# Patient Record
Sex: Female | Born: 1980 | Race: Black or African American | Hispanic: No | Marital: Single | Smoking: Current every day smoker
Health system: Southern US, Community
[De-identification: ages and names within clinical notes are randomized; demographics above are authoritative.]

## PROBLEM LIST (undated history)

## (undated) HISTORY — PX: TUBAL LIGATION: SHX77

---

## 2009-05-01 ENCOUNTER — Emergency Department: Payer: Self-pay | Admitting: Emergency Medicine

## 2009-10-12 IMAGING — US US PELV - US TRANSVAGINAL
1 series · 17 of 25 positions shown · non-contrast
Comparison: none

REASON FOR EXAM: right pelvic pain for one week. Patient has BTL
COMMENTS:   LMP: One week ago

PROCEDURE:     US  - US PELVIS EXAM W/TRANSVAGINAL  - May 01, 2009  [DATE]
RESULT:     Comparison: None
INDICATION: Right pelvic pain. LMP 04/23/2009
TECHNIQUE: Multiple transabdominal gray-scale images and endovaginal
gray-scale images  of the pelvis performed.

[Series 1: us pelv - us transvaginal · 17 of 63 slices shown]
[im 1/63]
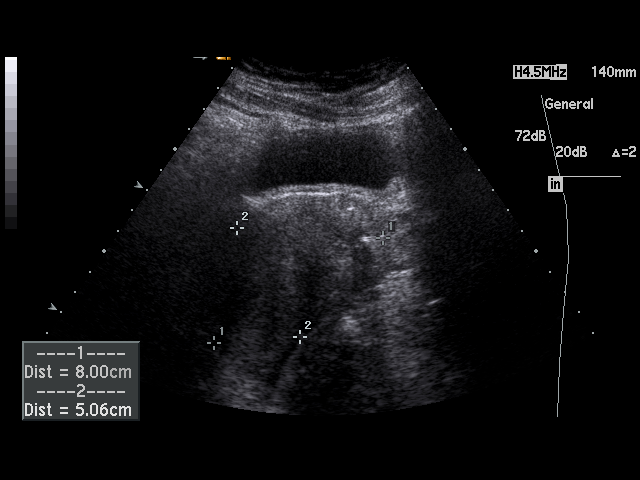
[im 6/63]
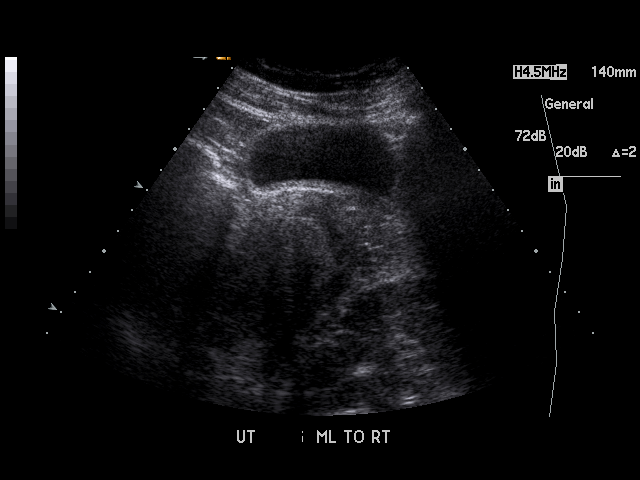
[im 8/63]
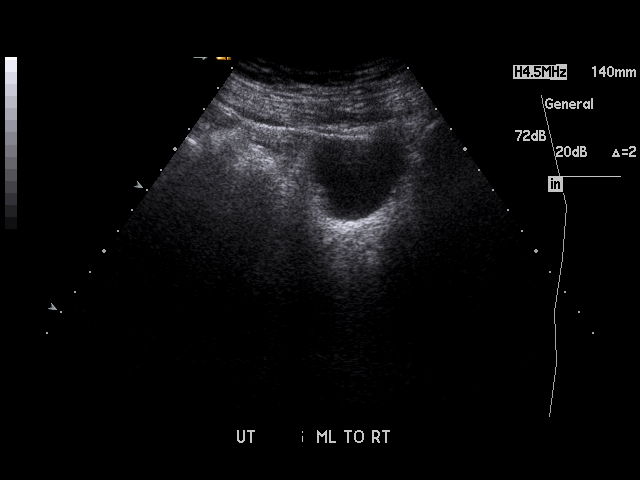
[im 13/63]
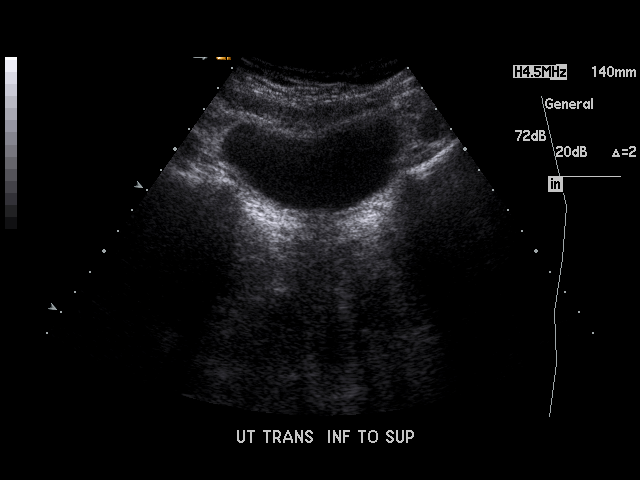
[im 16/63]
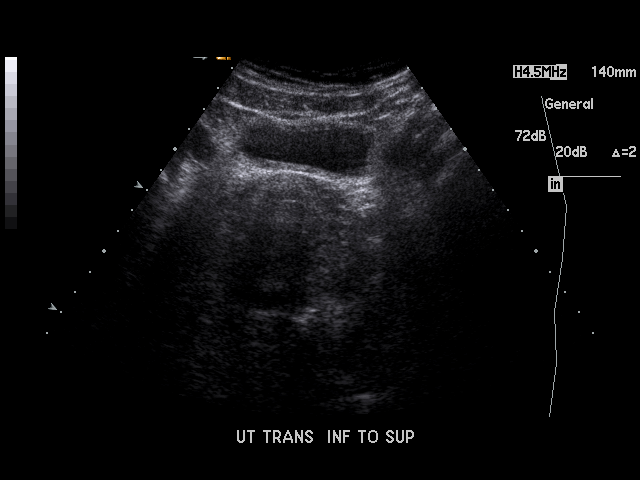
[im 21/63]
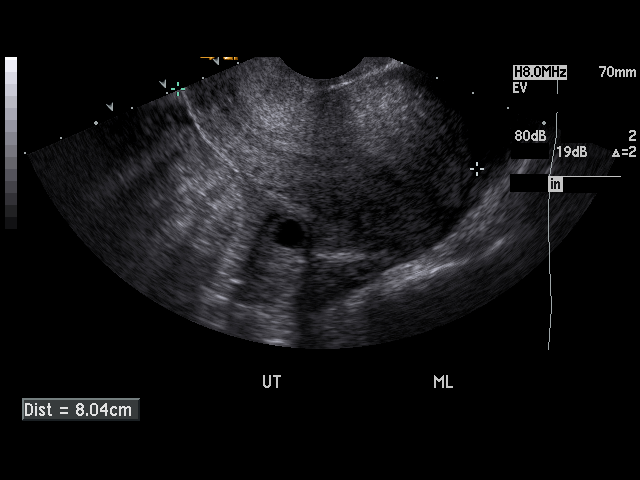
[im 24/63]
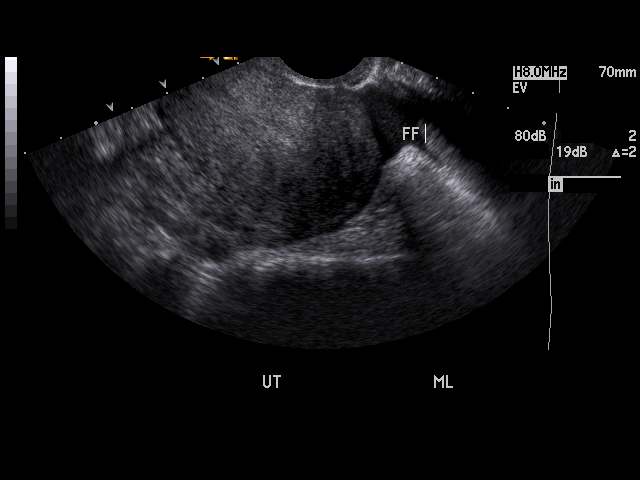
[im 29/63]
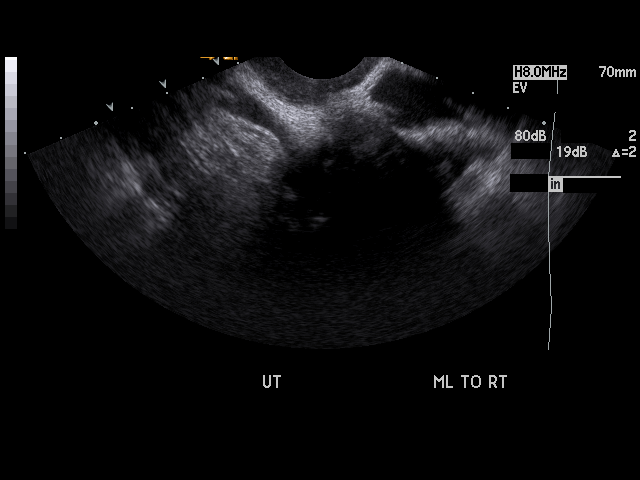
[im 32/63]
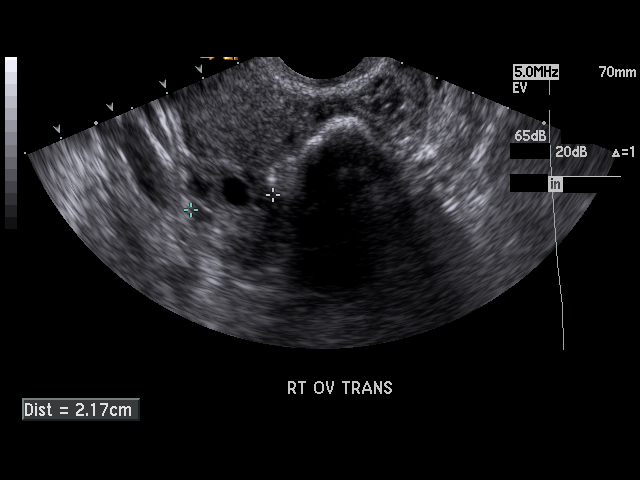
[im 34/63]
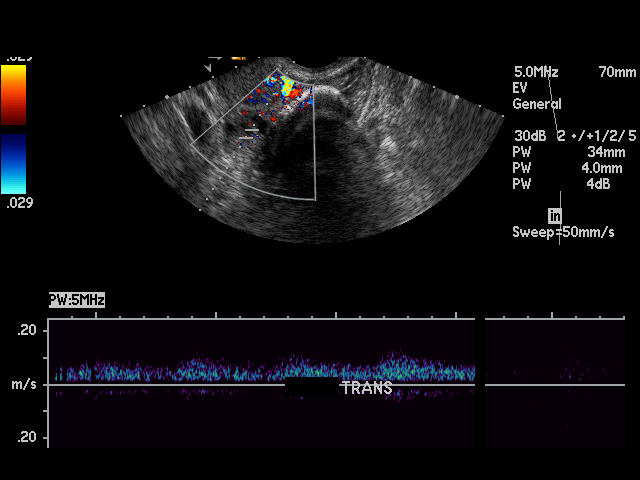
[im 39/63]
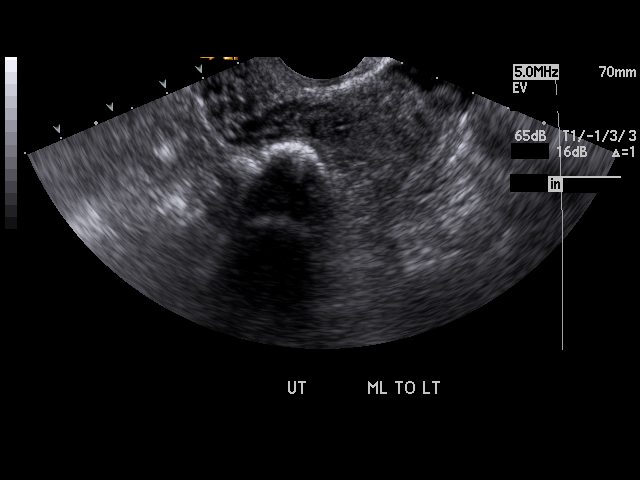
[im 42/63]
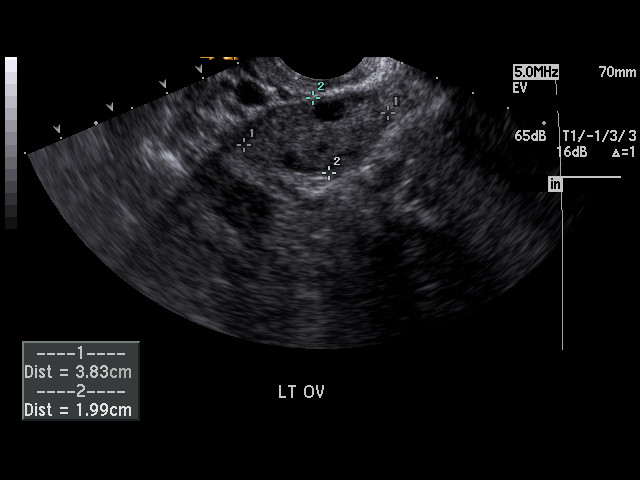
[im 47/63]
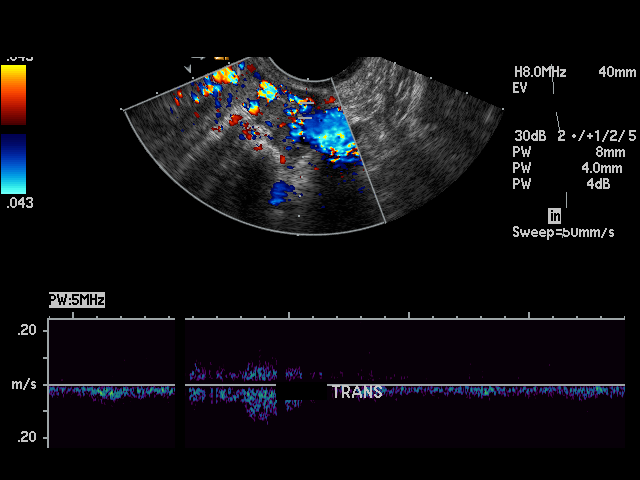
[im 50/63]
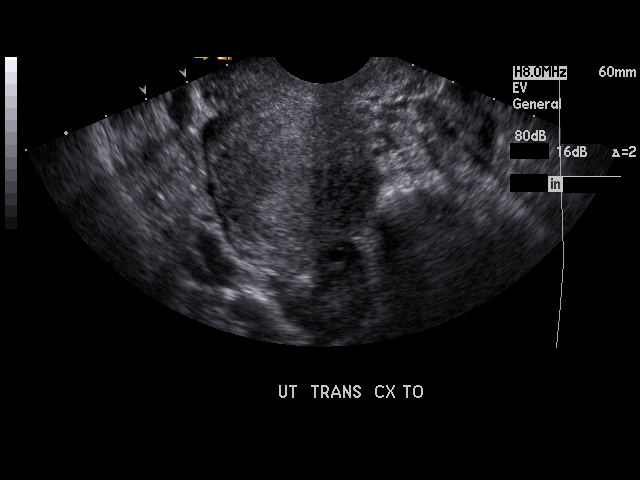
[im 55/63]
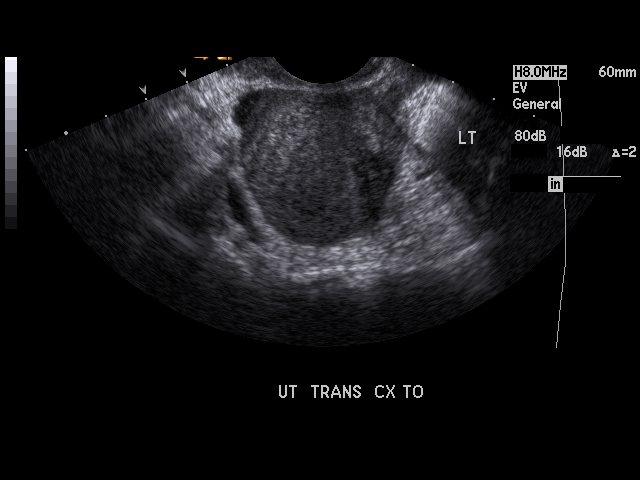
[im 57/63]
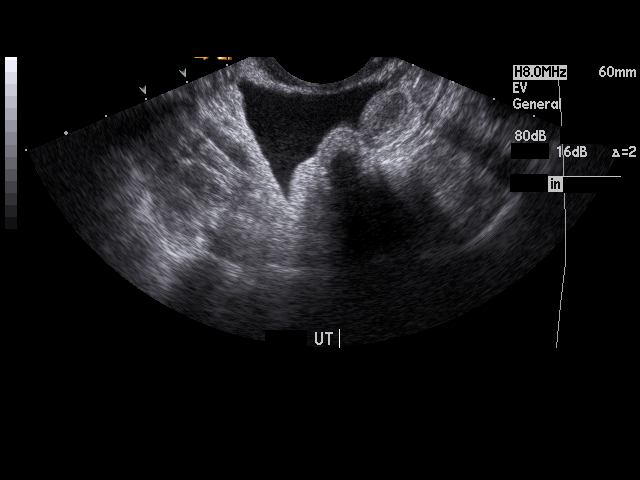
[im 63/63]
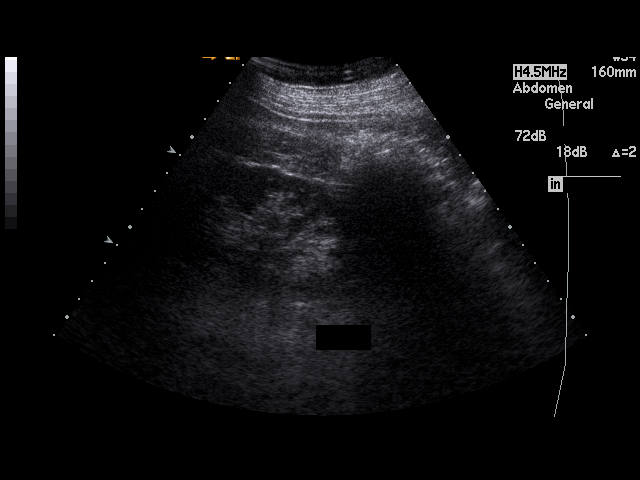

[17 of 25 positions shown; findings below may reference images not displayed]

FINDINGS: The uterus is normal in echotexture measuring 8 x 3.8 x 3.3 cm , with
transabdominal ultrasound. The endometrial stripe is uniform and homogeneous
measuring 6.1 mm.  There are no abnormal solid or cystic myometrial mass
lesions noted.

The right ovary measures 3 x 1.9 x 2.2 cm.  The left ovary measures 3.8 x 2
x 1.6 cm.  There is no adnexal mass. Bilateral arterial Doppler waveforms
are demonstrated. Venous Doppler waveforms are demonstrated on the left
side. Right ovarian venous Doppler could not be demonstrated secondary to
technical limitations. There are no other secondary findings to suggest
ovarian torsion. There is a trace amount of pelvic free fluid likely
physiologic.
IMPRESSION: Unremarkable pelvic ultrasound.

## 2012-12-13 ENCOUNTER — Emergency Department: Payer: Self-pay | Admitting: Emergency Medicine

## 2013-08-31 ENCOUNTER — Emergency Department: Payer: Self-pay | Admitting: Emergency Medicine

## 2013-08-31 LAB — BASIC METABOLIC PANEL
Anion Gap: 5 — ABNORMAL LOW (ref 7–16)
BUN: 20 mg/dL — ABNORMAL HIGH (ref 7–18)
CHLORIDE: 106 mmol/L (ref 98–107)
CREATININE: 0.98 mg/dL (ref 0.60–1.30)
Calcium, Total: 9 mg/dL (ref 8.5–10.1)
Co2: 26 mmol/L (ref 21–32)
EGFR (Non-African Amer.): >60
GLUCOSE: 91 mg/dL (ref 65–99)
OSMOLALITY: 276 (ref 275–301)
POTASSIUM: 3.6 mmol/L (ref 3.5–5.1)
Sodium: 137 mmol/L (ref 136–145)

## 2013-08-31 LAB — CBC
HCT: 39 % (ref 35.0–47.0)
HGB: 13.1 g/dL (ref 12.0–16.0)
MCH: 29.1 pg (ref 26.0–34.0)
MCHC: 33.5 g/dL (ref 32.0–36.0)
MCV: 87 fL (ref 80–100)
PLATELETS: 251 10*3/uL (ref 150–440)
RBC: 4.49 10*6/uL (ref 3.80–5.20)
RDW: 14.6 % — AB (ref 11.5–14.5)
WBC: 7.1 10*3/uL (ref 3.6–11.0)

## 2013-08-31 LAB — TROPONIN I: Troponin-I: <0.02 ng/mL

## 2015-05-08 IMAGING — CR DG CHEST 1V PORT
1 series · 1 of 1 positions shown · non-contrast
Comparison: None.

CLINICAL DATA: Chest pain

EXAM:
PORTABLE CHEST - 1 VIEW

[ap]
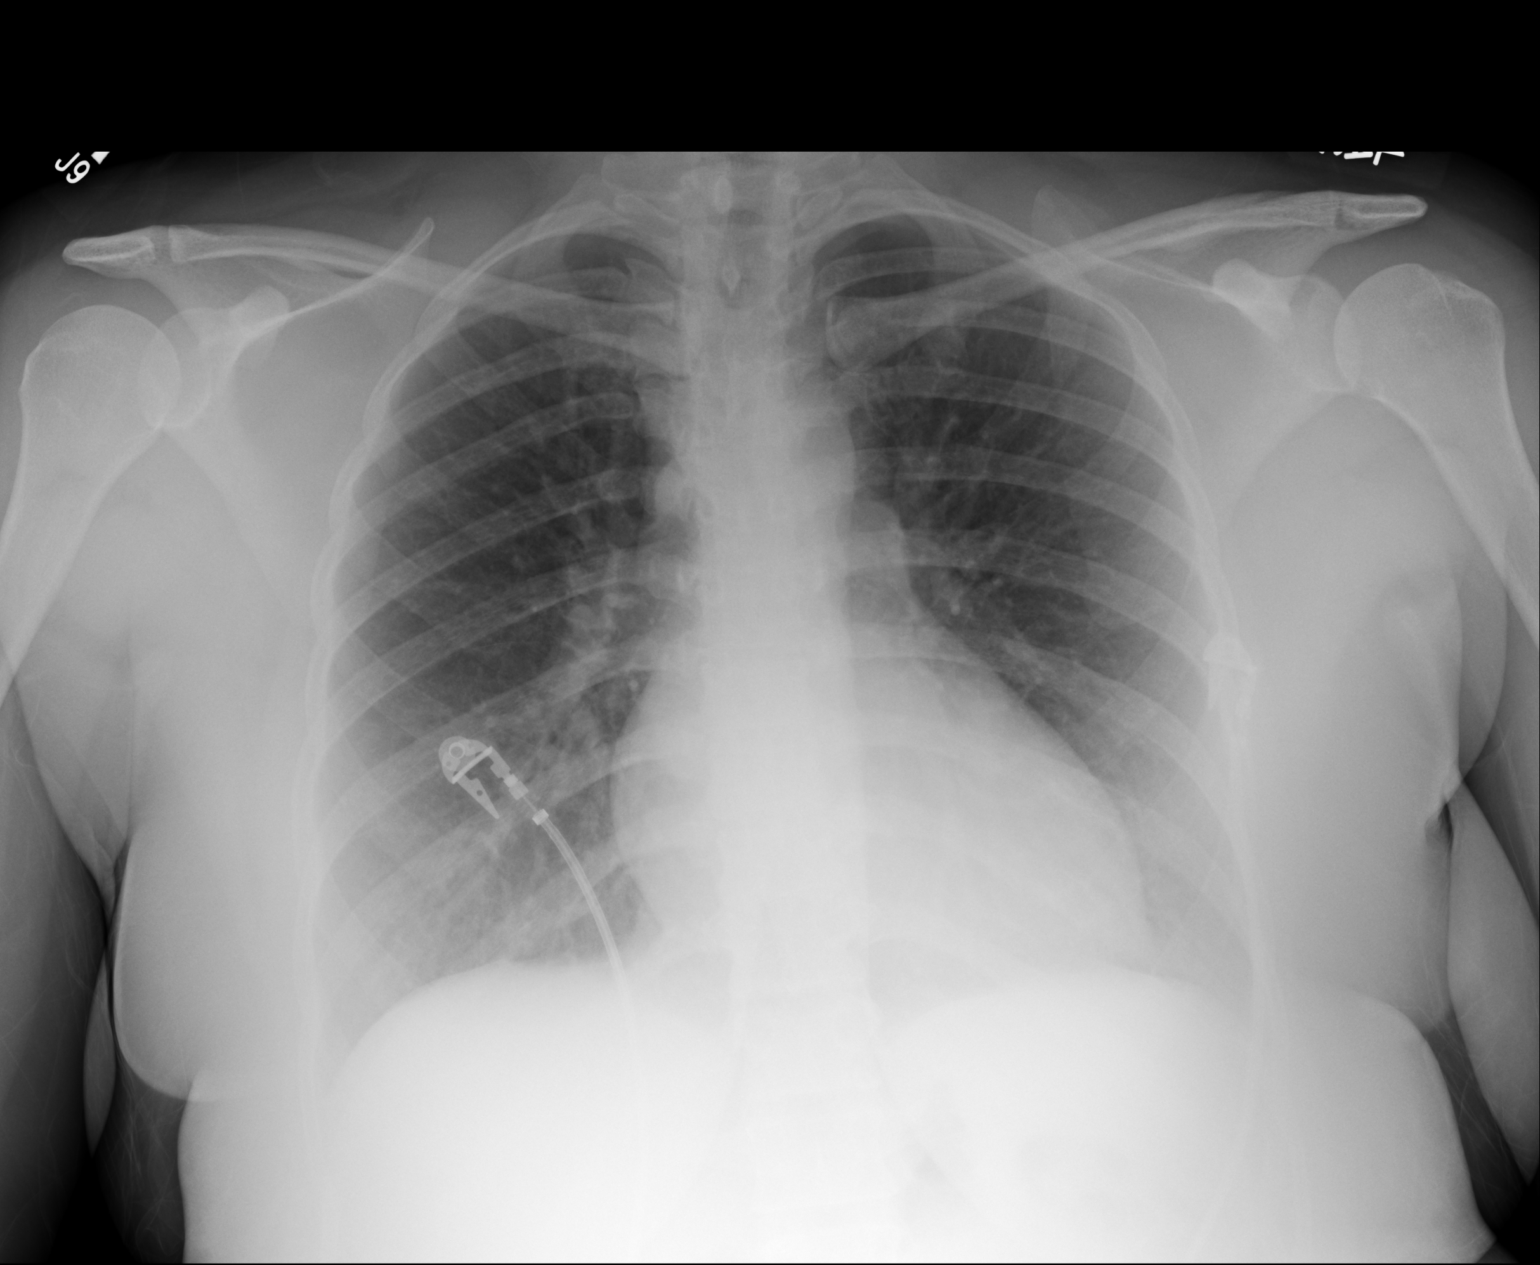

[1 of 1 positions shown; findings below may reference images not displayed]

FINDINGS: The lungs are clear. Heart is upper normal in size with normal
pulmonary vascularity. No adenopathy. No bone lesions. No
pneumothorax.
IMPRESSION: No edema or consolidation.

## 2015-06-03 ENCOUNTER — Emergency Department
Admission: EM | Admit: 2015-06-03 | Discharge: 2015-06-03 | Disposition: A | Discharge status: Home / Self Care | Payer: Medicaid Other | Attending: Emergency Medicine | Admitting: Emergency Medicine

## 2015-06-03 DIAGNOSIS — J039 Acute tonsillitis, unspecified: Secondary | ICD-10-CM | POA: Diagnosis not present

## 2015-06-03 DIAGNOSIS — J069 Acute upper respiratory infection, unspecified: Secondary | ICD-10-CM | POA: Insufficient documentation

## 2015-06-03 DIAGNOSIS — J029 Acute pharyngitis, unspecified: Secondary | ICD-10-CM | POA: Diagnosis present

## 2015-06-03 LAB — POCT RAPID STREP A: Streptococcus, Group A Screen (Direct): NEGATIVE

## 2015-06-03 MED ORDER — LIDOCAINE VISCOUS 2 % MT SOLN
15.0000 mL | Freq: Once | OROMUCOSAL | Status: AC
  Administered 2015-06-03: 15 mL via OROMUCOSAL
  Filled 2015-06-03: qty 15

## 2015-06-03 MED ORDER — IBUPROFEN 800 MG PO TABS: 800.0000 mg | ORAL_TABLET | Freq: Three times a day (TID) | ORAL | Status: DC | PRN

## 2015-06-03 MED ORDER — DIPHENHYDRAMINE HCL 12.5 MG/5ML PO ELIX
ORAL_SOLUTION | ORAL | Status: AC
  Administered 2015-06-03: 12.5 mg via ORAL
  Filled 2015-06-03: qty 10

## 2015-06-03 MED ORDER — PROMETHAZINE-DM 6.25-15 MG/5ML PO SYRP: 5.0000 mL | ORAL_SOLUTION | Freq: Four times a day (QID) | ORAL | Status: DC | PRN

## 2015-06-03 MED ORDER — DIPHENHYDRAMINE HCL 12.5 MG/5ML PO ELIX
12.5000 mg | ORAL_SOLUTION | Freq: Once | ORAL | Status: AC
  Administered 2015-06-03: 12.5 mg via ORAL
  Filled 2015-06-03: qty 5

## 2015-06-03 MED ORDER — AMOXICILLIN 500 MG PO CAPS: 500.0000 mg | ORAL_CAPSULE | Freq: Three times a day (TID) | ORAL | Status: DC

## 2015-06-03 NOTE — ED Notes (Signed)
Pt presents with c/o sore throat, fever to 99.8 onset yesterday. + runny nose, denies cough

## 2015-06-03 NOTE — ED Provider Notes (Signed)
[facility] Emergency Department Provider Note ____________________________________________  Time seen: Approximately 7:15 AM  I have reviewed the triage vital signs and the nursing notes.   HISTORY  Chief Complaint Sore Throat    HPI Elizabeth ResidesKaterina Myers is a 34 y.o. female patient complaining of 2 days of sore throat and nasal congestion and runny nose. Patient also states she has low-grade fever. No palliative measures taken for this complaint. Patient rates the pain discomfort as a 6/10.   History reviewed. No pertinent past medical history.  There are no active problems to display for this patient.   Past Surgical History  Procedure Laterality Date  . Tubal ligation      No current outpatient prescriptions on file.  Allergies Review of patient's allergies indicates no known allergies.  No family history on file.  Social History Social History  Substance Use Topics  . Smoking status: Never Smoker   . Smokeless tobacco: None  . Alcohol Use: Yes     Comment: on occasion    Review of Systems Constitutional: Fever and chills  Eyes: No visual changes. ENT: Sore throat Cardiovascular: Denies chest pain. Respiratory: Denies shortness of breath. Gastrointestinal: No abdominal pain.  No nausea, no vomiting.  No diarrhea.  No constipation. Genitourinary: Negative for dysuria. Musculoskeletal: Negative for back pain. Skin: Negative for rash. Neurological: Negative for headaches, focal weakness or numbness. 10-point ROS otherwise negative.  ____________________________________________   PHYSICAL EXAM:  VITAL SIGNS: ED Triage Vitals  Enc Vitals Group     BP 06/03/15 0655 131/87 mmHg     Pulse Rate 06/03/15 0655 82     Resp 06/03/15 0655 16     Temp 06/03/15 0655 98.2 F (36.8 C)     Temp Source 06/03/15 0655 Oral     SpO2 06/03/15 0655 98 %     Weight 06/03/15 0655 192 lb (87.091 kg)     Height 06/03/15 0655 5\' 3"  (1.6 m)     Head  Cir --      Peak Flow --      Pain Score 06/03/15 0656 6     Pain Loc --      Pain Edu? --      Excl. in GC? --     Constitutional: Alert and oriented. Well appearing and in no acute distress. Eyes: Conjunctivae are normal. PERRL. EOMI. Head: Atraumatic. Nose: No congestion/rhinnorhea. Mouth/Throat: Mucous membranes are moist.  Oropharynx erythematous. Bilateral exudative tonsils Neck: No stridor. No cervical spine tenderness to palpation. Hematological/Lymphatic/Immunilogical: Bilateral cervical lymphadenopathy. Cardiovascular: Normal rate, regular rhythm. Grossly normal heart sounds.  Good peripheral circulation. Respiratory: Normal respiratory effort.  No retractions. Lungs CTAB. Gastrointestinal: Soft and nontender. No distention. No abdominal bruits. No CVA tenderness. Musculoskeletal: No lower extremity tenderness nor edema.  No joint effusions. Neurologic:  Normal speech and language. No gross focal neurologic deficits are appreciated. No gait instability. Skin:  Skin is warm, dry and intact. No rash noted. Psychiatric: Mood and affect are normal. Speech and behavior are normal.  ____________________________________________   LABS (all labs ordered are listed, but only abnormal results are displayed)  Labs Reviewed  CULTURE, GROUP A STREP ([facility] ONLY)  POCT RAPID STREP A   ____________________________________________  EKG   ____________________________________________  RADIOLOGY   ____________________________________________   PROCEDURES  Procedure(s) performed: None  Critical Care performed: No  ____________________________________________   INITIAL IMPRESSION / ASSESSMENT AND PLAN / ED COURSE  Pertinent labs & imaging results that were available during my care of  the patient were reviewed by me and considered in my medical decision making (see chart for details).  Exudative tonsillitis. Upper rest or infection. Patient given a prescription for  amoxicillin, Phenergan DM, and ibuprofen. Patient given discharge instructions for home care. Patient [facility]. ____________________________________________   FINAL CLINICAL IMPRESSION(S) / ED DIAGNOSES  Final diagnoses:  Tonsillitis with exudate  URI (upper respiratory infection)      Joni Reining, PA-C 06/03/15 1478  Sharyn Creamer, MD 06/03/15 [phone]

## 2015-06-03 NOTE — Discharge Instructions (Signed)
Tonsillitis °Tonsillitis is an infection of the throat that causes the tonsils to become red, tender, and swollen. Tonsils are collections of lymphoid tissue at the back of the throat. Each tonsil has crevices (crypts). Tonsils help fight nose and throat infections and keep infection from spreading to other parts of the body for the first 18 months of life.  °CAUSES °Sudden (acute) tonsillitis is usually caused by infection with streptococcal bacteria. Long-lasting (chronic) tonsillitis occurs when the crypts of the tonsils become filled with pieces of food and bacteria, which makes it easy for the tonsils to become repeatedly infected. °SYMPTOMS  °Symptoms of tonsillitis include: °· A sore throat, with possible difficulty swallowing. °· White patches on the tonsils. °· Fever. °· Tiredness. °· New episodes of snoring during sleep, when you did not snore before. °· Small, foul-smelling, yellowish-white pieces of material (tonsilloliths) that you occasionally cough up or spit out. The tonsilloliths can also cause you to have bad breath. °DIAGNOSIS °Tonsillitis can be diagnosed through a physical exam. Diagnosis can be confirmed with the results of lab tests, including a throat culture. °TREATMENT  °The goals of tonsillitis treatment include the reduction of the severity and duration of symptoms and prevention of associated conditions. Symptoms of tonsillitis can be improved with the use of steroids to reduce the swelling. Tonsillitis caused by bacteria can be treated with antibiotic medicines. Usually, treatment with antibiotic medicines is started before the cause of the tonsillitis is known. However, if it is determined that the cause is not bacterial, antibiotic medicines will not treat the tonsillitis. If attacks of tonsillitis are severe and frequent, your health care provider may recommend surgery to remove the tonsils (tonsillectomy). °HOME CARE INSTRUCTIONS  °· Rest as much as possible and get plenty of  sleep. °· Drink plenty of fluids. While the throat is very sore, eat soft foods or liquids, such as sherbet, soups, or instant breakfast drinks. °· Eat frozen ice pops. °· Gargle with a warm or cold liquid to help soothe the throat. Mix 1/4 teaspoon of salt and 1/4 teaspoon of baking soda in 8 oz of water. °SEEK MEDICAL CARE IF:  °· Large, tender lumps develop in your neck. °· A rash develops. °· A green, yellow-brown, or bloody substance is coughed up. °· You are unable to swallow liquids or food for 24 hours. °· You notice that only one of the tonsils is swollen. °SEEK IMMEDIATE MEDICAL CARE IF:  °· You develop any new symptoms such as vomiting, severe headache, stiff neck, chest pain, or trouble breathing or swallowing. °· You have severe throat pain along with drooling or voice changes. °· You have severe pain, unrelieved with recommended medications. °· You are unable to fully open the mouth. °· You develop redness, swelling, or severe pain anywhere in the neck. °· You have a fever. °MAKE SURE YOU:  °· Understand these instructions. °· Will watch your condition. °· Will get help right away if you are not doing well or get worse. °  °This information is not intended to replace advice given to you by your health care provider. Make sure you discuss any questions you have with your health care provider. °  °Document Released: 04/17/2005 Document Revised: 07/29/2014 Document Reviewed: 12/25/2012 °Elsevier Interactive Patient Education ©2016 [employer] ° °Upper Respiratory Infection, Adult °Most upper respiratory infections (URIs) are a viral infection of the air passages leading to the lungs. A URI affects the nose, throat, and upper air passages. The most common type of URI   is nasopharyngitis and is typically referred to as "the common cold." °URIs run their course and usually go away on their own. Most of the time, a URI does not require medical attention, but sometimes a bacterial infection in the upper  airways can follow a viral infection. This is called a secondary infection. Sinus and middle ear infections are common types of secondary upper respiratory infections. °Bacterial pneumonia can also complicate a URI. A URI can worsen asthma and chronic obstructive pulmonary disease (COPD). Sometimes, these complications can require emergency medical care and may be life threatening.  °CAUSES °Almost all URIs are caused by viruses. A virus is a type of germ and can spread from one person to another.  °RISKS FACTORS °You may be at risk for a URI if:  °· You smoke.   °· You have chronic heart or lung disease. °· You have a weakened defense (immune) system.   °· You are very young or very old.   °· You have nasal allergies or asthma. °· You work in crowded or poorly ventilated areas. °· You work in health care facilities or schools. °SIGNS AND SYMPTOMS  °Symptoms typically develop 2-3 days after you come in contact with a cold virus. Most viral URIs last 7-10 days. However, viral URIs from the influenza virus (flu virus) can last 14-18 days and are typically more severe. Symptoms may include:  °· Runny or stuffy (congested) nose.   °· Sneezing.   °· Cough.   °· Sore throat.   °· Headache.   °· Fatigue.   °· Fever.   °· Loss of appetite.   °· Pain in your forehead, behind your eyes, and over your cheekbones (sinus pain). °· Muscle aches.   °DIAGNOSIS  °Your health care provider may diagnose a URI by: °· Physical exam. °· Tests to check that your symptoms are not due to another condition such as: °¨ Strep throat. °¨ Sinusitis. °¨ Pneumonia. °¨ Asthma. °TREATMENT  °A URI goes away on its own with time. It cannot be cured with medicines, but medicines may be prescribed or recommended to relieve symptoms. Medicines may help: °· Reduce your fever. °· Reduce your cough. °· Relieve nasal congestion. °HOME CARE INSTRUCTIONS  °· Take medicines only as directed by your health care provider.   °· Gargle warm saltwater or take cough  drops to comfort your throat as directed by your health care provider. °· Use a warm mist humidifier or inhale steam from a shower to increase air moisture. This may make it easier to breathe. °· Drink enough fluid to keep your urine clear or pale yellow.   °· Eat soups and other clear broths and maintain good nutrition.   °· Rest as needed.   °· Return to work when your temperature has returned to normal or as your health care provider advises. You may need to stay home longer to avoid infecting others. You can also use a face mask and careful hand washing to prevent spread of the virus. °· Increase the usage of your inhaler if you have asthma.   °· Do not use any tobacco products, including cigarettes, chewing tobacco, or electronic cigarettes. If you need help quitting, ask your health care provider. °PREVENTION  °The best way to protect yourself from getting a cold is to practice good hygiene.  °· Avoid oral or hand contact with people with cold symptoms.   °· Wash your hands often if contact occurs.   °There is no clear evidence that vitamin C, vitamin E, echinacea, or exercise reduces the chance of developing a cold. However, it is always recommended   to get plenty of rest, exercise, and practice good nutrition.  °SEEK MEDICAL CARE IF:  °· You are getting worse rather than better.   °· Your symptoms are not controlled by medicine.   °· You have chills. °· You have worsening shortness of breath. °· You have brown or red mucus. °· You have yellow or brown nasal discharge. °· You have pain in your face, especially when you bend forward. °· You have a fever. °· You have swollen neck glands. °· You have pain while swallowing. °· You have white areas in the back of your throat. °SEEK IMMEDIATE MEDICAL CARE IF:  °· You have severe or persistent: °¨ Headache. °¨ Ear pain. °¨ Sinus pain. °¨ Chest pain. °· You have chronic lung disease and any of the following: °¨ Wheezing. °¨ Prolonged cough. °¨ Coughing up blood. °¨ A  change in your usual mucus. °· You have a stiff neck. °· You have changes in your: °¨ Vision. °¨ Hearing. °¨ Thinking. °¨ Mood. °MAKE SURE YOU:  °· Understand these instructions. °· Will watch your condition. °· Will get help right away if you are not doing well or get worse. °  °This information is not intended to replace advice given to you by your health care provider. Make sure you discuss any questions you have with your health care provider. °  °Document Released: 01/01/2001 Document Revised: 11/22/2014 Document Reviewed: 10/13/2013 °Elsevier Interactive Patient Education ©2016 Elsevier Inc. ° °

## 2015-06-06 LAB — CULTURE, GROUP A STREP (THRC)

## 2019-05-17 ENCOUNTER — Ambulatory Visit: Payer: Self-pay | Admitting: Advanced Practice Midwife

## 2019-05-17 ENCOUNTER — Other Ambulatory Visit: Payer: Self-pay

## 2019-05-17 ENCOUNTER — Encounter: Payer: Self-pay | Admitting: Advanced Practice Midwife

## 2019-05-17 DIAGNOSIS — Z9851 Tubal ligation status: Secondary | ICD-10-CM | POA: Insufficient documentation

## 2019-05-17 DIAGNOSIS — Z113 Encounter for screening for infections with a predominantly sexual mode of transmission: Secondary | ICD-10-CM

## 2019-05-17 DIAGNOSIS — B9689 Other specified bacterial agents as the cause of diseases classified elsewhere: Secondary | ICD-10-CM

## 2019-05-17 DIAGNOSIS — F17200 Nicotine dependence, unspecified, uncomplicated: Secondary | ICD-10-CM | POA: Insufficient documentation

## 2019-05-17 DIAGNOSIS — N760 Acute vaginitis: Secondary | ICD-10-CM

## 2019-05-17 LAB — WET PREP FOR TRICH, YEAST, CLUE
Trichomonas Exam: NEGATIVE
Yeast Exam: NEGATIVE

## 2019-05-17 MED ORDER — METRONIDAZOLE 500 MG PO TABS: 500.0000 mg | ORAL_TABLET | Freq: Two times a day (BID) | ORAL | 0 refills | Status: AC

## 2019-05-17 NOTE — Addendum Note (Signed)
Addended by: Hal Morales A on: 05/17/2019 04:33 PM   Modules accepted: Orders

## 2019-05-17 NOTE — Progress Notes (Addendum)
Here today for STD screening. Accepts bloodwork. Hal Morales, RN Wet Prep results reviewed. Patient treated for BV per standing and verbal orders. Hal Morales, RN

## 2019-05-17 NOTE — Progress Notes (Signed)
    STI clinic/screening visit  Subjective:  Elizabeth Myers is a 38 y.o. female being seen today for an STI screening visit. The patient reports they do have symptoms.  Patient has the following medical conditions:   Patient Active Problem List   Diagnosis Date Noted  . Morbid obesity ([place]) 192 lbs 05/17/2019     Chief Complaint  Patient presents with  . SEXUALLY TRANSMITTED DISEASE    HPI  Patient reports yellow d/c x 4 days  See flowsheet for further details and programmatic requirements.    The following portions of the patient's history were reviewed and updated as appropriate: allergies, current medications, past medical history, past social history, past surgical history and problem list.  Objective:  There were no vitals filed for this visit.  Physical Exam Constitutional:      Appearance: Normal appearance. She is obese.  HENT:     Head: Normocephalic and atraumatic.     Mouth/Throat:     Mouth: Mucous membranes are moist.  Eyes:     Conjunctiva/sclera: Conjunctivae normal.  Neck:     Musculoskeletal: Normal range of motion and neck supple.  Pulmonary:     Effort: Pulmonary effort is normal.     Breath sounds: Normal breath sounds.  Abdominal:     Palpations: Abdomen is soft.     Comments: Soft without tenderness, poor tone, increased adipose  Genitourinary:    General: Normal vulva.     Exam position: Lithotomy position.     Pubic Area: No rash or pubic lice.      Labia:        Right: No lesion.        Left: No lesion.      Vagina: Normal.     Cervix: Normal.     Rectum: Normal.  Lymphadenopathy:     Lower Body: No right inguinal adenopathy. No left inguinal adenopathy.  Skin:    General: Skin is warm and dry.  Neurological:     Mental Status: She is alert.       Assessment and Plan:  Elizabeth Myers is a 38 y.o. female presenting to the [facility] Department for STI screening  1. Morbid obesity ([facility]) 192 lbs   2.  Screening examination for venereal disease Treat wet mount per standing orders Immunization nurse consult - WET PREP FOR [place], YEAST, CLUE - Syphilis Serology, [facility] - HIV [place] [facility] - Chlamydia/Gonorrhea [facility]     Return if symptoms worsen or fail to improve.  No future appointments.  Herbie Saxon, CNM

## 2019-09-02 ENCOUNTER — Other Ambulatory Visit: Payer: Self-pay | Admitting: Nurse Practitioner

## 2019-09-02 DIAGNOSIS — Z1231 Encounter for screening mammogram for malignant neoplasm of breast: Secondary | ICD-10-CM

## 2019-12-13 ENCOUNTER — Ambulatory Visit: Payer: Self-pay

## 2019-12-13 ENCOUNTER — Other Ambulatory Visit: Payer: Self-pay | Admitting: Family Medicine

## 2019-12-13 ENCOUNTER — Other Ambulatory Visit: Payer: Self-pay

## 2019-12-13 DIAGNOSIS — M25521 Pain in right elbow: Secondary | ICD-10-CM

## 2020-02-04 ENCOUNTER — Other Ambulatory Visit: Payer: Self-pay

## 2020-02-04 ENCOUNTER — Encounter: Payer: Self-pay | Admitting: Family Medicine

## 2020-02-04 ENCOUNTER — Ambulatory Visit: Payer: Self-pay | Admitting: Family Medicine

## 2020-02-04 DIAGNOSIS — N760 Acute vaginitis: Secondary | ICD-10-CM

## 2020-02-04 DIAGNOSIS — Z113 Encounter for screening for infections with a predominantly sexual mode of transmission: Secondary | ICD-10-CM

## 2020-02-04 DIAGNOSIS — B9689 Other specified bacterial agents as the cause of diseases classified elsewhere: Secondary | ICD-10-CM

## 2020-02-04 LAB — WET PREP FOR TRICH, YEAST, CLUE
Trichomonas Exam: NEGATIVE
Yeast Exam: NEGATIVE

## 2020-02-04 MED ORDER — METRONIDAZOLE 500 MG PO TABS: 500.0000 mg | ORAL_TABLET | Freq: Two times a day (BID) | ORAL | 0 refills | Status: AC

## 2020-02-04 NOTE — Progress Notes (Signed)
[facility] Department STI clinic/screening visit  Subjective:  Elizabeth Myers is a 39 y.o. female being seen today for  Chief Complaint  Patient presents with  . SEXUALLY TRANSMITTED DISEASE    Screening     The patient reports they do have symptoms. Patient reports that they do not desire a pregnancy in the next year. They reported they are not interested in discussing contraception today.   Patient has the following medical conditions:   Patient Active Problem List   Diagnosis Date Noted  . Morbid obesity ([facility]) 192 lbs 05/17/2019  . Smoker 8-13 cpd 05/17/2019  . H/O tubal ligation 2002 05/17/2019    HPI  Pt reports itching, yellow discharge, odor x1 wk. Would like STI screening.  See flowsheet for further details and programmatic requirements.    Patient's last menstrual period was 01/22/2020 (exact date). Last sex: 2 wks ago BCM: none Desires EC? n/a  Last pap per pt/review of record: due in August per pt, goes to PCP Last HIV test per pt/review of record: 04/2019  Last tetanus vaccine: 2016 per pt  No components found for: HCV  The following portions of the patient's history were reviewed and updated as appropriate: allergies, current medications, past medical history, past social history, past surgical history and problem list.  Objective:  There were no vitals filed for this visit.   Physical Exam Vitals and nursing note reviewed.  Constitutional:      Appearance: Normal appearance.  HENT:     Head: Normocephalic and atraumatic.     Mouth/Throat:     Mouth: Mucous membranes are moist.     Pharynx: Oropharynx is clear. No oropharyngeal exudate or posterior oropharyngeal erythema.  Pulmonary:     Effort: Pulmonary effort is normal.  Abdominal:     General: Abdomen is flat.     Palpations: There is no mass.     Tenderness: There is no abdominal tenderness. There is no rebound.  Genitourinary:    General: Normal vulva.     Exam position:  Lithotomy position.     Pubic Area: No rash or pubic lice.      Labia:        Right: No rash or lesion.        Left: No rash or lesion.      Vagina: Vaginal discharge (white, creamy, ph>4.5) present. No erythema, bleeding or lesions.     Cervix: No cervical motion tenderness, discharge, friability, lesion or erythema.     Uterus: Normal.      Adnexa: Right adnexa normal and left adnexa normal.     Rectum: Normal.  Lymphadenopathy:     Head:     Right side of head: No preauricular or posterior auricular adenopathy.     Left side of head: No preauricular or posterior auricular adenopathy.     Cervical: No cervical adenopathy.     Upper Body:     Right upper body: No supraclavicular or axillary adenopathy.     Left upper body: No supraclavicular or axillary adenopathy.     Lower Body: No right inguinal adenopathy. No left inguinal adenopathy.  Skin:    General: Skin is warm and dry.     Findings: No rash.  Neurological:     Mental Status: She is alert and oriented to person, place, and time.      Assessment and Plan:  [name] is a 39 y.o. female presenting to the [facility] Department for STI screening  1. Screening examination for venereal disease -Pt with symptoms. Screenings today as below. Treat wet prep per standing order. -Patient does not meet criteria for HepB, HepC Screening. Declines HIV and syphilis screenings. -Counseled on warning s/sx and when to seek care. Recommended condom use with all sex and discussed importance of condom use for STI prevention. - WET PREP FOR [place]ICH, YEAST, CLUE - Chlamydia/Gonorrhea [place] [facility]  2. BV (bacterial vaginosis) -Wet prep + for BV, RN treated per standing order as below: - metroNIDAZOLE (FLAGYL) 500 MG tablet; Take 1 tablet (500 mg total) by mouth 2 (two) times daily for 7 days.  Dispense: 14 tablet; Refill: 0   Return if symptoms worsen or fail to improve.  No future appointments.  Ann Held,  PA-C

## 2020-02-04 NOTE — Progress Notes (Signed)
[employer] results reviewed by provider Maximiano Coss, PA-C. Per provider orders patient treated for BV. Tawny Hopping, RN

## 2020-02-04 NOTE — Progress Notes (Signed)
Here today for STD screening. Declines bloodwork. Ariyon Mittleman, RN ? ?

## 2020-06-29 ENCOUNTER — Other Ambulatory Visit: Payer: Self-pay

## 2020-06-29 ENCOUNTER — Encounter (HOSPITAL_COMMUNITY): Payer: Self-pay | Admitting: Emergency Medicine

## 2020-06-29 ENCOUNTER — Emergency Department (HOSPITAL_COMMUNITY)
Admission: EM | Admit: 2020-06-29 | Discharge: 2020-06-29 | Disposition: A | Discharge status: Home / Self Care | Payer: 59 | Attending: Emergency Medicine | Admitting: Emergency Medicine

## 2020-06-29 ENCOUNTER — Ambulatory Visit (INDEPENDENT_AMBULATORY_CARE_PROVIDER_SITE_OTHER)
Admission: EM | Admit: 2020-06-29 | Discharge: 2020-06-29 | Disposition: A | Discharge status: Home / Self Care | Payer: 59 | Source: Home / Self Care

## 2020-06-29 DIAGNOSIS — R1032 Left lower quadrant pain: Secondary | ICD-10-CM | POA: Diagnosis not present

## 2020-06-29 DIAGNOSIS — R109 Unspecified abdominal pain: Secondary | ICD-10-CM

## 2020-06-29 DIAGNOSIS — R102 Pelvic and perineal pain: Secondary | ICD-10-CM | POA: Insufficient documentation

## 2020-06-29 DIAGNOSIS — R350 Frequency of micturition: Secondary | ICD-10-CM | POA: Diagnosis not present

## 2020-06-29 DIAGNOSIS — Z5321 Procedure and treatment not carried out due to patient leaving prior to being seen by health care provider: Secondary | ICD-10-CM | POA: Insufficient documentation

## 2020-06-29 DIAGNOSIS — Z3202 Encounter for pregnancy test, result negative: Secondary | ICD-10-CM | POA: Diagnosis not present

## 2020-06-29 DIAGNOSIS — N3001 Acute cystitis with hematuria: Secondary | ICD-10-CM

## 2020-06-29 LAB — POCT URINALYSIS DIPSTICK, ED / UC
Bilirubin Urine: NEGATIVE
Glucose, UA: NEGATIVE mg/dL
Ketones, ur: NEGATIVE mg/dL
Leukocytes,Ua: NEGATIVE
Nitrite: NEGATIVE
Protein, ur: NEGATIVE mg/dL
Specific Gravity, Urine: >=1.03 (ref 1.005–1.030)
Urobilinogen, UA: 0.2 mg/dL (ref 0.0–1.0)
pH: 5 (ref 5.0–8.0)

## 2020-06-29 LAB — COMPREHENSIVE METABOLIC PANEL
ALT: 11 U/L (ref 0–44)
AST: 13 U/L — ABNORMAL LOW (ref 15–41)
Albumin: 4 g/dL (ref 3.5–5.0)
Alkaline Phosphatase: 57 U/L (ref 38–126)
Anion gap: 7 (ref 5–15)
BUN: 19 mg/dL (ref 6–20)
CO2: 24 mmol/L (ref 22–32)
Calcium: 8.8 mg/dL — ABNORMAL LOW (ref 8.9–10.3)
Chloride: 106 mmol/L (ref 98–111)
Creatinine, Ser: 1 mg/dL (ref 0.44–1.00)
GFR, Estimated: >60 mL/min (ref >60)
Glucose, Bld: 139 mg/dL — ABNORMAL HIGH (ref 70–99)
Potassium: 3.7 mmol/L (ref 3.5–5.1)
Sodium: 137 mmol/L (ref 135–145)
Total Bilirubin: 0.3 mg/dL (ref 0.3–1.2)
Total Protein: 7.1 g/dL (ref 6.5–8.1)

## 2020-06-29 LAB — CBC
HCT: 38.7 % (ref 36.0–46.0)
Hemoglobin: 12.6 g/dL (ref 12.0–15.0)
MCH: 28.7 pg (ref 26.0–34.0)
MCHC: 32.6 g/dL (ref 30.0–36.0)
MCV: 88.2 fL (ref 80.0–100.0)
Platelets: 298 10*3/uL (ref 150–400)
RBC: 4.39 MIL/uL (ref 3.87–5.11)
RDW: 14.1 % (ref 11.5–15.5)
WBC: 12 10*3/uL — ABNORMAL HIGH (ref 4.0–10.5)
nRBC: 0 % (ref 0.0–0.2)

## 2020-06-29 LAB — POC URINE PREG, ED: Preg Test, Ur: NEGATIVE

## 2020-06-29 LAB — LIPASE, BLOOD: Lipase: 32 U/L (ref 11–51)

## 2020-06-29 MED ORDER — NITROFURANTOIN MONOHYD MACRO 100 MG PO CAPS: 100.0000 mg | ORAL_CAPSULE | Freq: Two times a day (BID) | ORAL | 0 refills | Status: AC

## 2020-06-29 NOTE — ED Triage Notes (Signed)
Patient is complaining of left lower abdominal pain and pelvic that started yesterday. Patient has no other symptoms.

## 2020-06-29 NOTE — ED Provider Notes (Addendum)
[facility]    CSN: [id number] Arrival date & time: 06/29/20  6834      History   Chief Complaint Chief Complaint  Patient presents with  . Abdominal Pain  . Urinary Frequency    HPI Elizabeth Myers is a 39 y.o. female presenting for lower abd pain and urinary odor x2 days. Endorses 4/10 left sided abd pain radiating to groin. Denies back pain, denies fevers/chills, denies STI sx/risk, denies n/v/d, denies dysuria, denies frequency. She went to [facility] ED last night but left before being seen. States that she has had a UTI in the past with similar symptoms, requiring antibiotic management.   HPI  History reviewed. No pertinent past medical history.  Patient Active Problem List   Diagnosis Date Noted  . Morbid obesity (HCC) 192 lbs 05/17/2019  . Smoker 8-13 cpd 05/17/2019  . H/O tubal ligation 2002 05/17/2019    Past Surgical History:  Procedure Laterality Date  . TUBAL LIGATION      OB History   No obstetric history on file.      Home Medications    Prior to Admission medications   Medication Sig Start Date End Date Taking? Authorizing Provider  nitrofurantoin, macrocrystal-monohydrate, (MACROBID) 100 MG capsule Take 1 capsule (100 mg total) by mouth 2 (two) times daily. 06/29/20   Rhys Martini, PA-C    Family History History reviewed. No pertinent family history.  Social History Social History   Tobacco Use  . Smoking status: Current Every Day Smoker    Packs/day: 0.75    Types: Cigarettes  . Smokeless tobacco: Never Used  Vaping Use  . Vaping Use: Never used  Substance Use Topics  . Alcohol use: Yes    Alcohol/week: 2.0 standard drinks    Types: 2 Standard drinks or equivalent per week    Comment: 3-4x/mo  . Drug use: Never     Allergies   Patient has no known allergies.   Review of Systems Review of Systems  Constitutional: Negative for chills and fever.  Gastrointestinal: Positive for abdominal pain. Negative for diarrhea,  nausea and vomiting.  Genitourinary: Negative for dysuria, flank pain, frequency, genital sores, hematuria, pelvic pain and urgency.  All other systems reviewed and are negative.    Physical Exam Triage Vital Signs ED Triage Vitals  Enc Vitals Group     BP 06/29/20 0838 116/84     Pulse Rate 06/29/20 0838 85     Resp 06/29/20 0838 16     Temp 06/29/20 0838 (!) 97.5 F (36.4 C)     Temp Source 06/29/20 0838 Oral     SpO2 06/29/20 0838 100 %     Weight --      Height --      Head Circumference --      Peak Flow --      Pain Score 06/29/20 0836 5     Pain Loc --      Pain Edu? --      Excl. in GC? --    No data found.  Updated Vital Signs BP 116/84 (BP Location: Right Arm)   Pulse 85   Temp (!) 97.5 F (36.4 C) (Oral)   Resp 16   LMP 06/05/2020   SpO2 100%   Visual Acuity Right Eye Distance:   Left Eye Distance:   Bilateral Distance:    Right Eye Near:   Left Eye Near:    Bilateral Near:     Physical Exam Vitals reviewed.  Constitutional:      Appearance: She is well-developed. She is not ill-appearing.  HENT:     Head: Normocephalic and atraumatic.  Cardiovascular:     Rate and Rhythm: Normal rate and regular rhythm.     Heart sounds: Normal heart sounds.  Pulmonary:     Breath sounds: Normal breath sounds.  Abdominal:     General: Abdomen is flat. Bowel sounds are normal. There is no distension.     Palpations: Abdomen is soft.     Tenderness: There is no abdominal tenderness. There is no right CVA tenderness, left CVA tenderness, guarding or rebound.  Neurological:     Mental Status: She is alert.      UC Treatments / Results  Labs (all labs ordered are listed, but only abnormal results are displayed) Labs Reviewed  POCT URINALYSIS DIPSTICK, ED / UC - Abnormal; Notable for the following components:      Result Value   Hgb urine dipstick TRACE (*)    All other components within normal limits  URINE CULTURE  POC URINE PREG, ED     EKG   Radiology No results found.  Procedures Procedures (including critical care time)  Medications Ordered in UC Medications - No data to display  Initial Impression / Assessment and Plan / UC Course  I have reviewed the triage vital signs and the nursing notes.  Pertinent labs & imaging results that were available during my care of the patient were reviewed by me and considered in my medical decision making (see chart for details).  Clinical Course as of 06/29/20 0931  Thu Jun 29, 2020  4967 POCT Urinalysis, Dipstick [LG]    Clinical Course User Index [LG] Rhys Martini, PA-C   UA today showing trace Hgb, otherwise wnl. Urine pregnancy negative. CBC drawn 12 hours ago showing WBC slightly elevated, otherwise wnl. Given patient history of UTI, discussed option of starting abx today vs awaiting culture results; patient prefers to start abx today. Plan to treat with Macrobid as below. Culture sent. Return precautions- worsening abd pain, new onset of back pain/fevers, worsening of symptoms, etc.   Final Clinical Impressions(s) / UC Diagnoses   Final diagnoses:  Acute cystitis with hematuria     Discharge Instructions     Take the antibiotic as directed. Continue to hydrate well. Seek medical attention if you have worsening abdominal or back pain, worsening urinary symptoms, or if you develop fevers.     ED Prescriptions    Medication Sig Dispense Auth. Provider   nitrofurantoin, macrocrystal-monohydrate, (MACROBID) 100 MG capsule Take 1 capsule (100 mg total) by mouth 2 (two) times daily. 10 capsule Rhys Martini, PA-C     PDMP not reviewed this encounter.   Rhys Martini, PA-C 06/29/20 0931    Rhys Martini, PA-C 06/29/20 [phone]

## 2020-06-29 NOTE — ED Triage Notes (Addendum)
Pt presents with lower abdominal pain and odor to urine xs 2 days. Pt went to [facility] ED last night but was not seen before leaving. Had blood work drawn and resulted.

## 2020-06-29 NOTE — Discharge Instructions (Signed)
Take the antibiotic as directed. Continue to hydrate well. Seek medical attention if you have worsening abdominal or back pain, worsening urinary symptoms, or if you develop fevers.

## 2020-06-30 LAB — URINE CULTURE: Culture: 40000 — AB

## 2020-07-01 ENCOUNTER — Ambulatory Visit (HOSPITAL_COMMUNITY)
Admission: EM | Admit: 2020-07-01 | Discharge: 2020-07-01 | Disposition: A | Discharge status: Home / Self Care | Payer: 59 | Attending: Physician Assistant | Admitting: Physician Assistant

## 2020-07-01 ENCOUNTER — Encounter (HOSPITAL_COMMUNITY): Payer: Self-pay | Admitting: Emergency Medicine

## 2020-07-01 ENCOUNTER — Other Ambulatory Visit: Payer: Self-pay

## 2020-07-01 DIAGNOSIS — R8290 Unspecified abnormal findings in urine: Secondary | ICD-10-CM | POA: Diagnosis not present

## 2020-07-01 DIAGNOSIS — F17210 Nicotine dependence, cigarettes, uncomplicated: Secondary | ICD-10-CM | POA: Insufficient documentation

## 2020-07-01 DIAGNOSIS — Z113 Encounter for screening for infections with a predominantly sexual mode of transmission: Secondary | ICD-10-CM | POA: Diagnosis not present

## 2020-07-01 DIAGNOSIS — R109 Unspecified abdominal pain: Secondary | ICD-10-CM | POA: Insufficient documentation

## 2020-07-01 LAB — POCT URINALYSIS DIPSTICK, ED / UC
Bilirubin Urine: NEGATIVE
Glucose, UA: NEGATIVE mg/dL
Ketones, ur: NEGATIVE mg/dL
Leukocytes,Ua: NEGATIVE
Nitrite: NEGATIVE
Protein, ur: NEGATIVE mg/dL
Specific Gravity, Urine: >=1.03 (ref 1.005–1.030)
Urobilinogen, UA: 0.2 mg/dL (ref 0.0–1.0)
pH: 5.5 (ref 5.0–8.0)

## 2020-07-01 NOTE — ED Provider Notes (Signed)
[facility]    CSN: [id number] Arrival date & time: 07/01/20  1004      History   Chief Complaint Chief Complaint  Patient presents with  . Follow-up    HPI Elizabeth Myers is a 39 y.o. female.   Patient here for follow up on pelvic pressure.  Urine culture positive for lactobacillus.  She was advised to come back for additional testing, so she returnes today.  Denies dysuria, frequency ,urgency, hematuria, vaginal discharge.  LMP now.  No known exposures to STDs.     History reviewed. No pertinent past medical history.  Patient Active Problem List   Diagnosis Date Noted  . Morbid obesity (HCC) 192 lbs 05/17/2019  . Smoker 8-13 cpd 05/17/2019  . H/O tubal ligation 2002 05/17/2019    Past Surgical History:  Procedure Laterality Date  . TUBAL LIGATION      OB History   No obstetric history on file.      Home Medications    Prior to Admission medications   Medication Sig Start Date End Date Taking? Authorizing Provider  nitrofurantoin, macrocrystal-monohydrate, (MACROBID) 100 MG capsule Take 1 capsule (100 mg total) by mouth 2 (two) times daily. 06/29/20   Rhys Martini, PA-C    Family History Family History  Problem Relation Age of Onset  . Diabetes Mother   . Diabetes Father     Social History Social History   Tobacco Use  . Smoking status: Current Every Day Smoker    Packs/day: 0.75    Types: Cigarettes  . Smokeless tobacco: Never Used  Vaping Use  . Vaping Use: Never used  Substance Use Topics  . Alcohol use: Yes    Alcohol/week: 2.0 standard drinks    Types: 2 Standard drinks or equivalent per week    Comment: 3-4x/mo  . Drug use: Never     Allergies   Patient has no known allergies.   Review of Systems Review of Systems  Constitutional: Negative for chills, fatigue and fever.  Gastrointestinal: Negative for abdominal pain, diarrhea, nausea and vomiting.  Genitourinary: Positive for pelvic pain (as discomfort) and  vaginal bleeding (on menstrual cycle). Negative for decreased urine volume, difficulty urinating, dyspareunia, dysuria, flank pain, frequency, hematuria, menstrual problem, urgency, vaginal discharge and vaginal pain.  Musculoskeletal: Negative for arthralgias, back pain and myalgias.  Skin: Negative for color change and rash.  Neurological: Negative for seizures and syncope.  Psychiatric/Behavioral: Negative for sleep disturbance. The patient is not nervous/anxious.   All other systems reviewed and are negative.    Physical Exam Triage Vital Signs ED Triage Vitals  Enc Vitals Group     BP 07/01/20 1019 118/87     Pulse Rate 07/01/20 1019 79     Resp --      Temp 07/01/20 1019 98.9 F (37.2 C)     Temp Source 07/01/20 1019 Oral     SpO2 07/01/20 1019 96 %     Weight --      Height --      Head Circumference --      Peak Flow --      Pain Score 07/01/20 1018 4     Pain Loc --      Pain Edu? --      Excl. in GC? --    No data found.  Updated Vital Signs BP 118/87 (BP Location: Left Arm)   Pulse 79   Temp 98.9 F (37.2 C) (Oral)   LMP 06/29/2020 (Exact Date)  SpO2 96%   Visual Acuity Right Eye Distance:   Left Eye Distance:   Bilateral Distance:    Right Eye Near:   Left Eye Near:    Bilateral Near:     Physical Exam Vitals and nursing note reviewed.  Constitutional:      General: She is not in acute distress.    Appearance: Normal appearance. She is well-developed. She is not ill-appearing.  HENT:     Head: Normocephalic and atraumatic.     Nose: Nose normal.  Eyes:     General: No scleral icterus.    Conjunctiva/sclera: Conjunctivae normal.     Pupils: Pupils are equal, round, and reactive to light.  Cardiovascular:     Rate and Rhythm: Normal rate and regular rhythm.  Pulmonary:     Effort: Pulmonary effort is normal. No respiratory distress.  Abdominal:     General: Bowel sounds are normal.     Palpations: Abdomen is soft.     Tenderness: There is  no abdominal tenderness. There is no right CVA tenderness, left CVA tenderness or guarding.  Genitourinary:    Comments: declined GU exam Musculoskeletal:     Cervical back: Normal range of motion and neck supple. No rigidity.  Skin:    General: Skin is warm and dry.     Capillary Refill: Capillary refill takes less than 2 seconds.  Neurological:     General: No focal deficit present.     Mental Status: She is alert and oriented to person, place, and time.     Motor: No weakness.     Gait: Gait normal.  Psychiatric:        Mood and Affect: Mood normal.        Behavior: Behavior normal.      UC Treatments / Results  Labs (all labs ordered are listed, but only abnormal results are displayed) Labs Reviewed  POCT URINALYSIS DIPSTICK, ED / UC - Abnormal; Notable for the following components:      Result Value   Hgb urine dipstick LARGE (*)    All other components within normal limits  CERVICOVAGINAL ANCILLARY ONLY    EKG   Radiology No results found.  Procedures Procedures (including critical care time)  Medications Ordered in UC Medications - No data to display  Initial Impression / Assessment and Plan / UC Course  I have reviewed the triage vital signs and the nursing notes.  Pertinent labs & imaging results that were available during my care of the patient were reviewed by me and considered in my medical decision making (see chart for details).     Well send out swab for GCT and BV/yeast.  UA Dip positive for blood, however, she started her cycle.  Negative for leuks and nitrates. No sx of UTI. If test is positive we will send antibiotics to pharmacy. If test is negative, follow up with PCP/OB. Final Clinical Impressions(s) / UC Diagnoses   Final diagnoses:  Abdominal pressure  Abnormal urine odor     Discharge Instructions     We will contact you with lab results in 2 - 5 days, the results will also be available on mychart.      ED Prescriptions     None     PDMP not reviewed this encounter.   Evern Core, PA-C 07/01/20 1107

## 2020-07-01 NOTE — ED Triage Notes (Signed)
Pt states that she is here for a follow from lab results. Pt states that she is still having pelvic discomfort.

## 2020-07-01 NOTE — Discharge Instructions (Signed)
We will contact you with lab results in 2 - 5 days, the results will also be available on mychart.

## 2020-07-03 LAB — CERVICOVAGINAL ANCILLARY ONLY
Bacterial Vaginitis (gardnerella): POSITIVE — AB
Candida Glabrata: NEGATIVE
Candida Vaginitis: NEGATIVE
Chlamydia: NEGATIVE
Comment: NEGATIVE
Comment: NEGATIVE
Comment: NEGATIVE
Comment: NEGATIVE
Comment: NEGATIVE
Comment: NORMAL
Neisseria Gonorrhea: NEGATIVE
Trichomonas: NEGATIVE

## 2020-07-04 ENCOUNTER — Telehealth (HOSPITAL_COMMUNITY): Payer: Self-pay | Admitting: Emergency Medicine

## 2020-07-04 MED ORDER — METRONIDAZOLE 500 MG PO TABS
500.0000 mg | ORAL_TABLET | Freq: Two times a day (BID) | ORAL | 0 refills | Status: DC
Start: 2020-07-04 — End: 2020-07-04

## 2020-07-04 MED ORDER — METRONIDAZOLE 500 MG PO TABS: 500.0000 mg | ORAL_TABLET | Freq: Two times a day (BID) | ORAL | 0 refills | Status: AC

## 2020-07-04 NOTE — Telephone Encounter (Signed)
Resent to pharmacy of request

## 2022-08-27 ENCOUNTER — Encounter: Payer: Self-pay | Admitting: Emergency Medicine

## 2022-08-27 ENCOUNTER — Emergency Department: Payer: 59

## 2022-08-27 ENCOUNTER — Emergency Department
Admission: EM | Admit: 2022-08-27 | Discharge: 2022-08-27 | Disposition: A | Discharge status: Home / Self Care | Payer: 59 | Attending: Emergency Medicine | Admitting: Emergency Medicine

## 2022-08-27 ENCOUNTER — Other Ambulatory Visit: Payer: Self-pay

## 2022-08-27 DIAGNOSIS — M545 Low back pain, unspecified: Secondary | ICD-10-CM

## 2022-08-27 LAB — POC URINE PREG, ED: Preg Test, Ur: NEGATIVE

## 2022-08-27 MED ORDER — MELOXICAM 15 MG PO TABS: 15.0000 mg | ORAL_TABLET | Freq: Every day | ORAL | 2 refills | Status: AC

## 2022-08-27 MED ORDER — CYCLOBENZAPRINE HCL 10 MG PO TABS: 10.0000 mg | ORAL_TABLET | Freq: Three times a day (TID) | ORAL | 0 refills | Status: AC | PRN

## 2022-08-27 MED ORDER — KETOROLAC TROMETHAMINE 30 MG/ML IJ SOLN
30.0000 mg | Freq: Once | INTRAMUSCULAR | Status: AC
  Administered 2022-08-27: 30 mg via INTRAMUSCULAR
  Filled 2022-08-27: qty 1

## 2022-08-27 MED ORDER — LIDOCAINE 5 % EX PTCH
1.0000 | MEDICATED_PATCH | CUTANEOUS | Status: DC
  Administered 2022-08-27: 1 via TRANSDERMAL
  Filled 2022-08-27: qty 1

## 2022-08-27 MED ORDER — CYCLOBENZAPRINE HCL 10 MG PO TABS
10.0000 mg | ORAL_TABLET | Freq: Once | ORAL | Status: AC
  Administered 2022-08-27: 10 mg via ORAL
  Filled 2022-08-27: qty 1

## 2022-08-27 NOTE — ED Triage Notes (Signed)
Pt here with lower back pain since yesterday morning. Pt denies injury, states she woke up with pain.

## 2022-08-27 NOTE — ED Provider Notes (Signed)
[facility] Provider Note    Event Date/Time   First MD Initiated Contact with Patient 08/27/22 1019     (approximate)   History   Back Pain   HPI  Elizabeth Myers is a 42 y.o. female with no significant past medical history presents emergency department with low back pain.  States started yesterday.  No known injury.  States she woke up with bad pain in the lower back that goes towards the hip but not down her legs.  Took over-the-counter medication without any relief.  Denies abdominal pain, urinary frequency      Physical Exam   Triage Vital Signs: ED Triage Vitals [08/27/22 0940]  Enc Vitals Group     BP (!) 131/92     Pulse Rate 88     Resp 18     Temp 98 F (36.7 C)     Temp Source Oral     SpO2 100 %     Weight 190 lb 0.6 oz (86.2 kg)     Height 5\' 3"  (1.6 m)     Head Circumference      Peak Flow      Pain Score 9     Pain Loc      Pain Edu?      Excl. in [place]?     Most recent vital signs: Vitals:   08/27/22 0940  BP: (!) 131/92  Pulse: 88  Resp: 18  Temp: 98 F (36.7 C)  SpO2: 100%     General: Awake, no distress.   CV:  Good peripheral perfusion. regular rate and  rhythm Resp:  Normal effort.  Abd:  No distention.   Other:  Patient moves slowly due to discomfort, lumbar spine is tender along L4-L5, 5 or 5 strength in lower extremities, neurovascular is intact   ED Results / Procedures / Treatments   Labs (all labs ordered are listed, but only abnormal results are displayed) Labs Reviewed  POC URINE PREG, ED     EKG     RADIOLOGY X-ray of the lumbar spine    PROCEDURES:   Procedures   MEDICATIONS ORDERED IN ED: Medications  lidocaine (LIDODERM) 5 % 1 patch (1 patch Transdermal Patch Applied 08/27/22 1147)  ketorolac (TORADOL) 30 MG/ML injection 30 mg (30 mg Intramuscular Given 08/27/22 1048)  cyclobenzaprine (FLEXERIL) tablet 10 mg (10 mg Oral Given 08/27/22 1047)     IMPRESSION / MDM /  [place] / ED COURSE  I reviewed the triage vital signs and the nursing notes.                              Differential diagnosis includes, but is not limited to, muscle strain, herniated disc, fracture  Patient's presentation is most consistent with acute complicated illness / injury requiring diagnostic workup.   X-ray of the lumbar spine, Toradol 30 mg IM, Flexeril 10 mg p.o. ordered after x-ray will reevaluate most likely give her a Lidoderm patch   X-ray lumbar spine independently reviewed interpreted by me as being negative for any acute abnormality  Patient did have relief with the medication.  We also applied a Lidoderm patch.  She was given a prescription for meloxicam and Flexeril.  She is to follow-up with her regular doctor if not improving in 3 to 4 days.  Where she can follow-up with orthopedics.  Gentle stretches were described.  She is in agreement treatment  plan.  Discharged stable condition.   FINAL CLINICAL IMPRESSION(S) / ED DIAGNOSES   Final diagnoses:  Acute midline low back pain without sciatica     Rx / DC Orders   ED Discharge Orders          Ordered    meloxicam (MOBIC) 15 MG tablet  Daily        08/27/22 1150    cyclobenzaprine (FLEXERIL) 10 MG tablet  3 times daily PRN        08/27/22 1150             Note:  This document was prepared using Dragon voice recognition software and may include unintentional dictation errors.    Versie Starks, PA-C 08/27/22 1151    Blake Divine, MD 08/27/22 [phone]

## 2022-08-27 NOTE — Discharge Instructions (Signed)
Follow-up with your regular doctor as needed.  Follow-up orthopedics if not improving 1 week.  Try gentle stretches for your mid lower back.  Apply ice to the lower back.  Take medications as prescribed.

## 2022-09-01 ENCOUNTER — Other Ambulatory Visit: Payer: Self-pay

## 2022-09-01 ENCOUNTER — Emergency Department: Payer: 59

## 2022-09-01 ENCOUNTER — Emergency Department
Admission: EM | Admit: 2022-09-01 | Discharge: 2022-09-01 | Disposition: A | Discharge status: Home / Self Care | Payer: 59 | Attending: Emergency Medicine | Admitting: Emergency Medicine

## 2022-09-01 DIAGNOSIS — R300 Dysuria: Secondary | ICD-10-CM

## 2022-09-01 DIAGNOSIS — S39012A Strain of muscle, fascia and tendon of lower back, initial encounter: Secondary | ICD-10-CM

## 2022-09-01 DIAGNOSIS — X58XXXA Exposure to other specified factors, initial encounter: Secondary | ICD-10-CM | POA: Diagnosis not present

## 2022-09-01 DIAGNOSIS — M545 Low back pain, unspecified: Secondary | ICD-10-CM

## 2022-09-01 DIAGNOSIS — S3992XA Unspecified injury of lower back, initial encounter: Secondary | ICD-10-CM | POA: Diagnosis present

## 2022-09-01 LAB — URINALYSIS, ROUTINE W REFLEX MICROSCOPIC
Bilirubin Urine: NEGATIVE
Glucose, UA: NEGATIVE mg/dL
Ketones, ur: NEGATIVE mg/dL
Leukocytes,Ua: NEGATIVE
Nitrite: NEGATIVE
Protein, ur: NEGATIVE mg/dL
Specific Gravity, Urine: 1.017 (ref 1.005–1.030)
pH: 6 (ref 5.0–8.0)

## 2022-09-01 MED ORDER — PREDNISONE 10 MG PO TABS: 10.0000 mg | ORAL_TABLET | Freq: Every day | ORAL | 0 refills | Status: DC

## 2022-09-01 MED ORDER — PREDNISONE 10 MG PO TABS: 10.0000 mg | ORAL_TABLET | Freq: Every day | ORAL | 0 refills | Status: AC

## 2022-09-01 NOTE — ED Triage Notes (Signed)
Pt presents to ED with lower back pain, pt states recently seen for this, pt states possible urinary s/s. NAD noted. Pt denies fevers or chills.

## 2022-09-01 NOTE — ED Provider Notes (Signed)
[facility] Provider Note   CSN: [id number] Arrival date & time: 09/01/22  [id number]     History  Chief Complaint  Patient presents with   Back Pain    Elizabeth Myers is a 42 y.o. female.  With no past medical history presents to the emergency department for evaluation of bilateral lower back pain with dysuria.  She describes a little burning with urination this been present for the last day or 2.  She has had 6 days of lower back pain.  Was seen 5 days ago in the ER, had negative urine and normal x-rays of the lumbar spine.  She denies any history of kidney stones.  She has noticed little bit of blood in urine.  She denies any fevers, abdominal pain, nausea vomiting or diarrhea.  No numbness tingling or radicular symptoms.  No loss of bowel or bladder symptoms.  No vaginal discharge.  History of chronic hematuria.  HPI     Home Medications Prior to Admission medications   Medication Sig Start Date End Date Taking? Authorizing Provider  predniSONE (DELTASONE) 10 MG tablet Take 1 tablet (10 mg total) by mouth daily. 6,5,4,3,2,1 six day taper 09/01/22  Yes Duanne Guess, PA-C  cyclobenzaprine (FLEXERIL) 10 MG tablet Take 1 tablet (10 mg total) by mouth 3 (three) times daily as needed. 08/27/22   Fisher, Linden Dolin, PA-C  meloxicam (MOBIC) 15 MG tablet Take 1 tablet (15 mg total) by mouth daily. 08/27/22 08/27/23  Versie Starks, PA-C  metroNIDAZOLE (FLAGYL) 500 MG tablet Take 1 tablet (500 mg total) by mouth 2 (two) times daily. 07/04/20   Lamptey, Myrene Galas, MD  nitrofurantoin, macrocrystal-monohydrate, (MACROBID) 100 MG capsule Take 1 capsule (100 mg total) by mouth 2 (two) times daily. 06/29/20   Hazel Sams, PA-C      Allergies    Patient has no known allergies.    Review of Systems   Review of Systems  Physical Exam Updated Vital Signs BP (!) 135/100 (BP Location: Left Arm)   Pulse 83   Temp 98.4 F (36.9 C) (Oral)   Resp 16    LMP 08/25/2022 (Exact Date)   SpO2 98%  Physical Exam Constitutional:      Appearance: She is well-developed.  HENT:     Head: Normocephalic and atraumatic.  Eyes:     Conjunctiva/sclera: Conjunctivae normal.  Cardiovascular:     Rate and Rhythm: Normal rate.  Pulmonary:     Effort: Pulmonary effort is normal. No respiratory distress.  Abdominal:     General: There is no distension.     Tenderness: There is no abdominal tenderness. There is no right CVA tenderness, left CVA tenderness or guarding.  Musculoskeletal:        General: Normal range of motion.     Cervical back: Normal range of motion.     Comments: Mild soft tissue tenderness along the lower lumbar spine at the lumbosacral junction with no midline spinous process tenderness.  Both hips move well with internal ex rotation with no discomfort.  Number muscle spasms noted throughout the lower back.  No CVA tenderness bilaterally.  Neurovascular intact in bilateral lower extremities  Skin:    General: Skin is warm.     Findings: No rash.  Neurological:     General: No focal deficit present.     Mental Status: She is alert and oriented to person, place, and time.  Psychiatric:  Mood and Affect: Mood normal.        Behavior: Behavior normal.        Thought Content: Thought content normal.     ED Results / Procedures / Treatments   Labs (all labs ordered are listed, but only abnormal results are displayed) Labs Reviewed  URINALYSIS, ROUTINE W REFLEX MICROSCOPIC - Abnormal; Notable for the following components:      Result Value   Color, Urine YELLOW (*)    APPearance HAZY (*)    Hgb urine dipstick MODERATE (*)    Bacteria, UA RARE (*)    All other components within normal limits  URINE CULTURE    EKG None  Radiology CT Renal Stone Study  Result Date: 09/01/2022 CLINICAL DATA:  Flank and low back pain.  Microscopic hematuria. EXAM: CT ABDOMEN AND PELVIS WITHOUT CONTRAST TECHNIQUE: Multidetector CT  imaging of the abdomen and pelvis was performed following the standard protocol without IV contrast. RADIATION DOSE REDUCTION: This exam was performed according to the departmental dose-optimization program which includes automated exposure control, adjustment of the mA and/or kV according to patient size and/or use of iterative reconstruction technique. COMPARISON:  None Available. FINDINGS: Lower chest: No acute findings. Hepatobiliary: No mass visualized on this unenhanced exam. Tiny calcified gallstone noted, however there is no evidence of cholecystitis or biliary ductal dilatation. Pancreas: No mass or inflammatory process visualized on this unenhanced exam. Spleen:  Within normal limits in size. Adrenals/Urinary tract: No evidence of urolithiasis or hydronephrosis. Unremarkable unopacified urinary bladder. Stomach/Bowel: No evidence of obstruction, inflammatory process, or abnormal fluid collections. Normal appendix visualized. Vascular/Lymphatic: No pathologically enlarged lymph nodes identified. No evidence of abdominal aortic aneurysm. Reproductive: Uterus is mildly enlarged and rounded configuration, likely due to fibroids. Adnexal regions are unremarkable. Other:  None. Musculoskeletal:  No suspicious bone lesions identified. IMPRESSION: No evidence of urolithiasis, hydronephrosis, or other acute findings. Cholelithiasis. No radiographic evidence of cholecystitis. Mildly enlarged uterus, likely due to fibroids. Pelvic ultrasound could be performed for further evaluation if clinically warranted. Electronically Signed   By: Marlaine Hind M.D.   On: 09/01/2022 09:43    Procedures Procedures    Medications Ordered in ED Medications - No data to display  ED Course/ Medical Decision Making/ A&P                             Medical Decision Making Amount and/or Complexity of Data Reviewed Labs: ordered. Radiology: ordered.  42 year old female with slight dysuria with back pain.  She was treated  with meloxicam and Flexeril recently with some improvement of the lower back pain but was concerned about having dysuria today.  Patient's urine analysis today showed no signs of infection.  She was noted to have hematuria.  His CT was obtained and reviewed by me today of the abdomen pelvis looking for kidney stone and there was no signs of kidney stones today.  Remainder of the CT appeared well.  Previous urinalysis is reviewed showing chronic hematuria.  Recommend follow-up with urology.  She is given prednisone to take for lower back pain which does have some muscular components that she is tender along the left and right lower lumbar spine.  She denies any radicular symptoms.  No neurological deficits.  Vital signs are stable.  She understands signs and symptoms return to the ER for. Final Clinical Impression(s) / ED Diagnoses Final diagnoses:  Acute bilateral low back pain without sciatica  Strain of  lumbar region, initial encounter  Dysuria    Rx / DC Orders ED Discharge Orders          Ordered    predniSONE (DELTASONE) 10 MG tablet  Daily        09/01/22 1007              Renata Caprice 09/01/22 1011    Carrie Mew, MD 09/02/22 1842

## 2022-09-01 NOTE — Discharge Instructions (Signed)
Please continue with muscle relaxer.  Take disown taper as prescribed.  Please follow-up with urology to discuss chronic hematuria.  Return to the ER for any worsening symptoms or any urgent changes in your health

## 2022-09-02 LAB — URINE CULTURE: Special Requests: NORMAL

## 2022-11-08 ENCOUNTER — Ambulatory Visit: Payer: 59 | Admitting: Nurse Practitioner

## 2022-11-13 ENCOUNTER — Encounter: Payer: 59 | Admitting: Nurse Practitioner

## 2023-03-14 ENCOUNTER — Ambulatory Visit: Payer: Self-pay | Admitting: Family Medicine

## 2023-03-14 DIAGNOSIS — B3731 Acute candidiasis of vulva and vagina: Secondary | ICD-10-CM

## 2023-03-14 DIAGNOSIS — Z113 Encounter for screening for infections with a predominantly sexual mode of transmission: Secondary | ICD-10-CM

## 2023-03-14 LAB — WET PREP FOR TRICH, YEAST, CLUE: Trichomonas Exam: NEGATIVE

## 2023-03-14 LAB — HM HIV SCREENING LAB: HM HIV Screening: NEGATIVE

## 2023-03-14 MED ORDER — CLOTRIMAZOLE-BETAMETHASONE 1-0.05 % EX CREA
1.0000 | TOPICAL_CREAM | Freq: Every day | CUTANEOUS | Status: AC
Start: 2023-03-14 — End: ?

## 2023-03-14 NOTE — Progress Notes (Signed)
[facility] Department  STI clinic/screening visit [address] [place] [address] [phone]  Subjective:  Elizabeth Myers is a 42 y.o. female being seen today for an STI screening visit. The patient reports they do have symptoms.  Patient reports that they do not desire a pregnancy in the next year.   They reported they are not interested in discussing contraception today.    Patient's last menstrual period was 02/15/2023.  Patient has the following medical conditions:   Patient Active Problem List   Diagnosis Date Noted   Morbid obesity (HCC) 192 lbs 05/17/2019   Smoker 8-13 cpd 05/17/2019   H/O tubal ligation 2002 05/17/2019    Chief Complaint  Patient presents with   SEXUALLY TRANSMITTED DISEASE    Screening. "Light burning sensation and white vaginal discharge x 2 weeks"    HPI  Patient reports to clinic with a 2 week hx of vaginal discharge and burning. States she has had 1 partner.   Does the patient using douching products? No  Last HIV test per patient/review of record was No results found for: "HMHIVSCREEN" No results found for: "HIV" Patient reports last pap was No results found for: "DIAGPAP" No results found for: "SPECADGYN"  Screening for MPX risk: Does the patient have an unexplained rash? No Is the patient MSM? No Does the patient endorse multiple sex partners or anonymous sex partners? No Did the patient have close or sexual contact with a person diagnosed with MPX? No Has the patient traveled outside the [place] where MPX is endemic? No Is there a high clinical suspicion for MPX-- evidenced by one of the following No  -Unlikely to be chickenpox  -Lymphadenopathy  -Rash that present in same phase of evolution on any given body part See flowsheet for further details and programmatic requirements.   Immunization history:   There is no immunization history on file for this patient.   The following portions of the  patient's history were reviewed and updated as appropriate: allergies, current medications, past medical history, past social history, past surgical history and problem list.  Objective:  There were no vitals filed for this visit.  Physical Exam Vitals and nursing note reviewed.  Constitutional:      Appearance: Normal appearance.  HENT:     Head: Normocephalic and atraumatic.     Mouth/Throat:     Mouth: Mucous membranes are moist.     Pharynx: Oropharynx is clear. No oropharyngeal exudate or posterior oropharyngeal erythema.  Pulmonary:     Effort: Pulmonary effort is normal.  Abdominal:     General: Abdomen is flat.     Palpations: There is no mass.     Tenderness: There is no abdominal tenderness. There is no rebound.  Genitourinary:    General: Normal vulva.     Exam position: Lithotomy position.     Pubic Area: No rash or pubic lice.      Labia:        Right: No rash or lesion.        Left: No rash or lesion.      Vagina: Normal. No vaginal discharge, erythema, bleeding or lesions.     Cervix: No cervical motion tenderness, discharge, friability, lesion or erythema.     Uterus: Normal.      Adnexa: Right adnexa normal and left adnexa normal.     Rectum: Normal.     Comments: pH = 4  Thick white discharge Lymphadenopathy:  Head:     Right side of head: No preauricular or posterior auricular adenopathy.     Left side of head: No preauricular or posterior auricular adenopathy.     Cervical: No cervical adenopathy.     Upper Body:     Right upper body: No supraclavicular, axillary or epitrochlear adenopathy.     Left upper body: No supraclavicular, axillary or epitrochlear adenopathy.     Lower Body: No right inguinal adenopathy. No left inguinal adenopathy.  Skin:    General: Skin is warm and dry.     Findings: No rash.  Neurological:     Mental Status: She is alert and oriented to person, place, and time.      Assessment and Plan:  Elizabeth Myers is a 42 y.o. female presenting to the [facility] Department for STI screening  1. Screening for venereal disease  - Chlamydia/Gonorrhea [facility] - HIV [facility] - Syphilis Serology, [facility] - WET PREP FOR TRICH, YEAST, CLUE   Patient accepted all screenings including vaginal CT/GC and bloodwork for HIV/RPR, and wet prep. Patient meets criteria for HepB screening? No. Ordered? not applicable Patient meets criteria for HepC screening? No. Ordered? not applicable  Treat wet prep per standing order Discussed time line for [facility] results and that patient will be called with positive results and encouraged patient to call if she had not heard in 2 weeks.  Counseled to return or seek care for continued or worsening symptoms Recommended repeat testing in 3 months with positive results. Recommended condom use with all sex  Patient is currently using Sterilization by Laparoscopy to prevent pregnancy.    Return if symptoms worsen or fail to improve, for STI screening.  Future Appointments  Date Time Provider Department Center  03/14/2023  3:10 PM Lenice Llamas, FNP [facility] None  Total time spent 20 minutes   Lenice Llamas, [place]

## 2023-03-14 NOTE — Progress Notes (Signed)
Pt is here for STD visit.  Wet prep results reviewed with pt, treatment required per standing order.  Condoms declined.  The patient was dispensed clotraimazole today. I provided counseling today regarding the medication. We discussed the medication, the side effects and when to call clinic. Patient given the opportunity to ask questions. Questions answered.   Gaspar Garbe, RN

## 2023-03-27 ENCOUNTER — Telehealth: Payer: Self-pay

## 2023-03-27 NOTE — Telephone Encounter (Signed)
Pt notified of positive STD results.  Treatment appointment made for 9/9 @4 :00.  Berdie Ogren, RN

## 2023-03-31 ENCOUNTER — Ambulatory Visit: Payer: Self-pay

## 2023-03-31 DIAGNOSIS — A749 Chlamydial infection, unspecified: Secondary | ICD-10-CM

## 2023-03-31 MED ORDER — DOXYCYCLINE HYCLATE 100 MG PO TABS
100.0000 mg | ORAL_TABLET | Freq: Two times a day (BID) | ORAL | Status: AC
Start: 2023-03-31 — End: 2023-04-07

## 2023-03-31 NOTE — Progress Notes (Signed)
Pt is here for treatment of Chlamydia.  Pt has had a BTL. The patient was dispensed Doxycycline 100 mg #14 today. I provided counseling today regarding the medication. We discussed the medication, the side effects and when to call clinic. Patient given the opportunity to ask questions. Questions answered.  Chlamydia pamphlet, Doxycycline information sheet and condoms given.  Berdie Ogren, RN

## 2023-05-15 ENCOUNTER — Other Ambulatory Visit: Payer: Self-pay

## 2023-05-15 DIAGNOSIS — I10 Essential (primary) hypertension: Secondary | ICD-10-CM

## 2023-05-15 DIAGNOSIS — R7301 Impaired fasting glucose: Secondary | ICD-10-CM

## 2023-05-15 DIAGNOSIS — E785 Hyperlipidemia, unspecified: Secondary | ICD-10-CM

## 2023-05-27 ENCOUNTER — Encounter: Payer: Self-pay | Admitting: Cardiology
